# Patient Record
Sex: Male | Born: 1980 | Race: White | Hispanic: No | Marital: Single | Smoking: Never smoker
Health system: Southern US, Community
[De-identification: ages and names within clinical notes are randomized; demographics above are authoritative.]

## PROBLEM LIST (undated history)

## (undated) DIAGNOSIS — K921 Melena: Secondary | ICD-10-CM

## (undated) DIAGNOSIS — R112 Nausea with vomiting, unspecified: Secondary | ICD-10-CM

## (undated) DIAGNOSIS — F329 Major depressive disorder, single episode, unspecified: Secondary | ICD-10-CM

## (undated) DIAGNOSIS — R569 Unspecified convulsions: Secondary | ICD-10-CM

## (undated) DIAGNOSIS — F32A Depression, unspecified: Secondary | ICD-10-CM

## (undated) DIAGNOSIS — B019 Varicella without complication: Secondary | ICD-10-CM

## (undated) DIAGNOSIS — I82409 Acute embolism and thrombosis of unspecified deep veins of unspecified lower extremity: Secondary | ICD-10-CM

## (undated) HISTORY — DX: Depression, unspecified: F32.A

## (undated) HISTORY — PX: CERVICAL FUSION: SHX112

## (undated) HISTORY — DX: Melena: K92.1

## (undated) HISTORY — DX: Major depressive disorder, single episode, unspecified: F32.9

## (undated) HISTORY — PX: SHOULDER SURGERY: SHX246

## (undated) HISTORY — DX: Varicella without complication: B01.9

## (undated) HISTORY — PX: NECK SURGERY: SHX720

## (undated) HISTORY — PX: OTHER SURGICAL HISTORY: SHX169

---

## 2001-10-15 ENCOUNTER — Encounter: Payer: Self-pay | Admitting: Emergency Medicine

## 2001-10-15 ENCOUNTER — Emergency Department (HOSPITAL_COMMUNITY): Admission: EM | Admit: 2001-10-15 | Discharge: 2001-10-15 | Payer: Self-pay | Admitting: Emergency Medicine

## 2001-11-29 ENCOUNTER — Emergency Department (HOSPITAL_COMMUNITY): Admission: EM | Admit: 2001-11-29 | Discharge: 2001-11-29 | Payer: Self-pay | Admitting: Emergency Medicine

## 2001-12-02 ENCOUNTER — Emergency Department (HOSPITAL_COMMUNITY): Admission: EM | Admit: 2001-12-02 | Discharge: 2001-12-02 | Payer: Self-pay | Admitting: *Deleted

## 2018-06-10 ENCOUNTER — Emergency Department (HOSPITAL_COMMUNITY): Payer: 59

## 2018-06-10 ENCOUNTER — Emergency Department (HOSPITAL_COMMUNITY)
Admission: EM | Admit: 2018-06-10 | Discharge: 2018-06-11 | Disposition: A | Discharge status: Home / Self Care | Payer: 59 | Attending: Emergency Medicine | Admitting: Emergency Medicine

## 2018-06-10 ENCOUNTER — Encounter (HOSPITAL_COMMUNITY): Payer: Self-pay

## 2018-06-10 DIAGNOSIS — Y92481 Parking lot as the place of occurrence of the external cause: Secondary | ICD-10-CM | POA: Diagnosis not present

## 2018-06-10 DIAGNOSIS — S40212A Abrasion of left shoulder, initial encounter: Secondary | ICD-10-CM | POA: Insufficient documentation

## 2018-06-10 DIAGNOSIS — S90512A Abrasion, left ankle, initial encounter: Secondary | ICD-10-CM | POA: Diagnosis not present

## 2018-06-10 DIAGNOSIS — Y999 Unspecified external cause status: Secondary | ICD-10-CM | POA: Insufficient documentation

## 2018-06-10 DIAGNOSIS — R413 Other amnesia: Secondary | ICD-10-CM | POA: Diagnosis not present

## 2018-06-10 DIAGNOSIS — Z79899 Other long term (current) drug therapy: Secondary | ICD-10-CM | POA: Diagnosis not present

## 2018-06-10 DIAGNOSIS — Z23 Encounter for immunization: Secondary | ICD-10-CM | POA: Diagnosis not present

## 2018-06-10 DIAGNOSIS — Y9355 Activity, bike riding: Secondary | ICD-10-CM | POA: Insufficient documentation

## 2018-06-10 DIAGNOSIS — Z7901 Long term (current) use of anticoagulants: Secondary | ICD-10-CM | POA: Insufficient documentation

## 2018-06-10 DIAGNOSIS — T07XXXA Unspecified multiple injuries, initial encounter: Secondary | ICD-10-CM

## 2018-06-10 DIAGNOSIS — S50312A Abrasion of left elbow, initial encounter: Secondary | ICD-10-CM | POA: Insufficient documentation

## 2018-06-10 DIAGNOSIS — S0181XA Laceration without foreign body of other part of head, initial encounter: Secondary | ICD-10-CM | POA: Insufficient documentation

## 2018-06-10 DIAGNOSIS — T1490XA Injury, unspecified, initial encounter: Secondary | ICD-10-CM

## 2018-06-10 DIAGNOSIS — S0990XA Unspecified injury of head, initial encounter: Secondary | ICD-10-CM

## 2018-06-10 DIAGNOSIS — S060X1A Concussion with loss of consciousness of 30 minutes or less, initial encounter: Secondary | ICD-10-CM

## 2018-06-10 HISTORY — DX: Unspecified convulsions: R56.9

## 2018-06-10 LAB — CBC
HCT: 41.9 % (ref 39.0–52.0)
Hemoglobin: 13.3 g/dL (ref 13.0–17.0)
MCH: 29 pg (ref 26.0–34.0)
MCHC: 31.7 g/dL (ref 30.0–36.0)
MCV: 91.5 fL (ref 78.0–100.0)
PLATELETS: 268 10*3/uL (ref 150–400)
RBC: 4.58 MIL/uL (ref 4.22–5.81)
RDW: 13.6 % (ref 11.5–15.5)
WBC: 8.1 10*3/uL (ref 4.0–10.5)

## 2018-06-10 LAB — COMPREHENSIVE METABOLIC PANEL
ALT: 33 U/L (ref 0–44)
AST: 26 U/L (ref 15–41)
Albumin: 3.4 g/dL — ABNORMAL LOW (ref 3.5–5.0)
Alkaline Phosphatase: 57 U/L (ref 38–126)
Anion gap: 11 (ref 5–15)
BUN: 21 mg/dL — ABNORMAL HIGH (ref 6–20)
CO2: 18 mmol/L — AB (ref 22–32)
CREATININE: 1.54 mg/dL — AB (ref 0.61–1.24)
Calcium: 8.2 mg/dL — ABNORMAL LOW (ref 8.9–10.3)
Chloride: 107 mmol/L (ref 98–111)
GFR, EST NON AFRICAN AMERICAN: 55 mL/min — AB (ref >60)
GLUCOSE: 112 mg/dL — AB (ref 70–99)
Potassium: 3.7 mmol/L (ref 3.5–5.1)
Sodium: 136 mmol/L (ref 135–145)
TOTAL PROTEIN: 6.8 g/dL (ref 6.5–8.1)
Total Bilirubin: 0.6 mg/dL (ref 0.3–1.2)

## 2018-06-10 LAB — I-STAT CHEM 8, ED
BUN: 25 mg/dL — ABNORMAL HIGH (ref 6–20)
CALCIUM ION: 1.06 mmol/L — AB (ref 1.15–1.40)
CREATININE: 1.5 mg/dL — AB (ref 0.61–1.24)
Chloride: 108 mmol/L (ref 98–111)
Glucose, Bld: 111 mg/dL — ABNORMAL HIGH (ref 70–99)
HEMATOCRIT: 40 % (ref 39.0–52.0)
HEMOGLOBIN: 13.6 g/dL (ref 13.0–17.0)
Potassium: 3.8 mmol/L (ref 3.5–5.1)
Sodium: 141 mmol/L (ref 135–145)
TCO2: 21 mmol/L — AB (ref 22–32)

## 2018-06-10 MED ORDER — TETANUS-DIPHTH-ACELL PERTUSSIS 5-2.5-18.5 LF-MCG/0.5 IM SUSP
0.5000 mL | Freq: Once | INTRAMUSCULAR | Status: AC
  Administered 2018-06-11: 0.5 mL via INTRAMUSCULAR
  Filled 2018-06-10: qty 0.5

## 2018-06-10 MED ORDER — SODIUM CHLORIDE 0.9 % IV BOLUS
1000.0000 mL | Freq: Once | INTRAVENOUS | Status: AC
  Administered 2018-06-11: 1000 mL via INTRAVENOUS

## 2018-06-10 MED ORDER — FENTANYL CITRATE (PF) 100 MCG/2ML IJ SOLN
50.0000 ug | Freq: Once | INTRAMUSCULAR | Status: AC
  Administered 2018-06-11: 50 ug via INTRAVENOUS
  Filled 2018-06-10: qty 2

## 2018-06-10 NOTE — ED Notes (Signed)
In CT at this time
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
## 2018-06-10 NOTE — ED Notes (Signed)
Delay in lab draw,  Pt not in room 
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
## 2018-06-11 ENCOUNTER — Other Ambulatory Visit (HOSPITAL_COMMUNITY): Payer: Self-pay

## 2018-06-11 ENCOUNTER — Emergency Department (HOSPITAL_COMMUNITY): Payer: 59

## 2018-06-11 LAB — PROTIME-INR
INR: 1.01
Prothrombin Time: 13.2 seconds (ref 11.4–15.2)

## 2018-06-11 MED ORDER — HYDROCODONE-ACETAMINOPHEN 5-325 MG PO TABS: 1.0000 | ORAL_TABLET | Freq: Three times a day (TID) | ORAL | 0 refills | Status: AC | PRN

## 2018-06-11 MED ORDER — FENTANYL CITRATE (PF) 100 MCG/2ML IJ SOLN
50.0000 ug | Freq: Once | INTRAMUSCULAR | Status: AC
  Administered 2018-06-11: 50 ug via INTRAVENOUS
  Filled 2018-06-11: qty 2

## 2018-06-11 MED ORDER — ACETAMINOPHEN 500 MG PO TABS: 1000.0000 mg | ORAL_TABLET | Freq: Three times a day (TID) | ORAL | 0 refills | Status: AC

## 2018-06-11 MED ORDER — LIDOCAINE-EPINEPHRINE (PF) 2 %-1:200000 IJ SOLN
10.0000 mL | Freq: Once | INTRAMUSCULAR | Status: AC
  Administered 2018-06-11: 10 mL via INTRADERMAL

## 2018-06-11 MED ORDER — LIDOCAINE-EPINEPHRINE 2 %-1:100000 IJ SOLN
10.0000 mL | Freq: Once | INTRAMUSCULAR | Status: DC
  Filled 2018-06-11: qty 10

## 2018-06-11 MED ORDER — IOHEXOL 300 MG/ML  SOLN
100.0000 mL | Freq: Once | INTRAMUSCULAR | Status: AC | PRN
  Administered 2018-06-11: 100 mL via INTRAVENOUS

## 2018-06-11 NOTE — ED Notes (Signed)
Assisted patient to dress in scrubs to go home.  Continues to have pain all over with difficulty bearing weight on left ankle.  Dr Eudelia Bunchcardama made aware.  Attempted to place cam walker but unable to tolerate.  Sent home with patient.  Able to turn and pivot from bed to chair and chair to car.

## 2018-06-12 ENCOUNTER — Telehealth: Payer: Self-pay
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
## 2018-06-14 ENCOUNTER — Ambulatory Visit: Payer: 59 | Admitting: Family Medicine

## 2018-06-14 ENCOUNTER — Encounter: Payer: Self-pay | Admitting: Family Medicine

## 2018-06-14 DIAGNOSIS — S93492A Sprain of other ligament of left ankle, initial encounter: Secondary | ICD-10-CM | POA: Diagnosis not present

## 2018-06-14 DIAGNOSIS — S060XAA Concussion with loss of consciousness status unknown, initial encounter: Secondary | ICD-10-CM | POA: Insufficient documentation

## 2018-06-14 DIAGNOSIS — S060X1A Concussion with loss of consciousness of 30 minutes or less, initial encounter: Secondary | ICD-10-CM | POA: Diagnosis not present

## 2018-06-14 DIAGNOSIS — S93402A Sprain of unspecified ligament of left ankle, initial encounter: Secondary | ICD-10-CM | POA: Insufficient documentation

## 2018-06-14 DIAGNOSIS — S060X9A Concussion with loss of consciousness of unspecified duration, initial encounter: Secondary | ICD-10-CM | POA: Insufficient documentation

## 2018-06-14 NOTE — Patient Instructions (Addendum)
Good to see you  Gabriel Whitney is your friend. Ice 20 minutes 2 times daily. Usually after activity and before bed. pennsaid pinkie amount topically 2 times daily as needed.  For the head  Fish oil 3 grams daily  Vitamin D 2000 IU daily  CoQ10 200mg  daily  I will see you again on Monday

## 2018-06-14 NOTE — Assessment & Plan Note (Signed)
Patient has significant tenderness on the lateral fibular region.  Discussed with patient about a cam walker.  Do not feel comfortable with patient being on crutches at the moment.  Patient did have x-rays at the time of injury there were independently visualized by me showing no significant bony abnormality but joint effusion noted.  He did continue to have pain follow-up will consider further evaluation.

## 2018-06-14 NOTE — Assessment & Plan Note (Signed)
Mild concussion possible.  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  We discussed over-the-counter medications, patient will come back again in 5 days for retesting.  Hopefully will be able to get him back to work soon.  I do not see any concern for this taking a long time but patient's other comorbidities makes evaluation difficult.

## 2018-06-15 NOTE — Progress Notes (Signed)
Tawana ScaleZach Shakeria Robinette D.O. [facility] [address] [place], [place] [address] Phone: [phone] Subjective:    I'm seeing this patient by the request  of:    CC: Head injury follow-up  VFI:[id number]:Subjective  Gabriel Whitney is a 37 y.o. male coming in with complaint of head injury. He has been improving. Still feels dizziness and has pain in the left frontal bone. The blurriness in his left eye is still present but improving.  Symptom score improved from previous exam.  Still only the dizziness seems to be emanating.  Headache significantly improved.     Past Medical History:  Diagnosis Date  . Blood in stool   . Chicken pox   . Depression   . Seizures (HCC)    Past Surgical History:  Procedure Laterality Date  . arthroscopic right shoulder    . CERVICAL FUSION    . NECK SURGERY    . SHOULDER SURGERY Left   . SHOULDER SURGERY Right    Social History   Socioeconomic History  . Marital status: Single    Spouse name: Not on file  . Number of children: Not on file  . Years of education: Not on file  . Highest education level: Not on file  Occupational History  . Not on file  Social Needs  . Financial resource strain: Not on file  . Food insecurity:    Worry: Not on file    Inability: Not on file  . Transportation needs:    Medical: Not on file    Non-medical: Not on file  Tobacco Use  . Smoking status: Never Smoker  . Smokeless tobacco: Never Used  Substance and Sexual Activity  . Alcohol use: Yes    Frequency: Never  . Drug use: Yes    Types: Marijuana  . Sexual activity: Not on file  Lifestyle  . Physical activity:    Days per week: Not on file    Minutes per session: Not on file  . Stress: Not on file  Relationships  . Social connections:    Talks on phone: Not on file    Gets together: Not on file    Attends religious service: Not on file    Active member of club or organization: Not on file    Attends meetings of clubs or organizations: Not on file    Relationship status: Not on file  Other Topics Concern  . Not on file  Social History Narrative  . Not on file   No Known Allergies No family history on file.   Past medical history, social, surgical and family history all reviewed in electronic medical record.  No pertanent information unless stated regarding to the chief complaint.   Review of Systems:Review of systems updated and as accurate as of 06/19/18  No  visual changes, nausea, vomiting, diarrhea, constipation,  abdominal pain, skin rash, fevers, chills, night sweats, weight loss, swollen lymph nodes, body aches, joint swelling, muscle aches, chest pain, shortness of breath, mood changes.  Positive headaches dizziness  Objective  Blood pressure 138/88, pulse 79, height 6\' 2"  (1.88 m), weight 173 lb (78.5 kg), SpO2 (!) 81 %. Systems examined below as of 06/19/18   General: No apparent distress alert and oriented x3 mood and affect normal, dressed appropriately.  Patient though is very anxious HEENT: Pupils equal, extraocular movements intact  Respiratory: Patient's speak in full sentences and does not appear short of breath  Cardiovascular: No lower extremity edema, non tender, no erythema  Skin: Warm dry intact with no signs of infection or rash on extremities or on axial skeleton.  Abdomen: Soft nontender  Neuro: Cranial nerves II through XII are intact, neurovascularly intact in all extremities with 2+ DTRs and 2+ pulses.  Lymph: No lymphadenopathy of posterior or anterior cervical chain or axillae bilaterally.  Gait normal with good balance and coordination.  MSK:  Non tender with full range of motion and good stability and symmetric strength and tone of shoulders, elbows, wrist, hip, knee and ankles bilaterally.  Neck: Inspection mild loss of lordosis surgical incision well-healed. No palpable stepoffs. Negative Spurling's maneuver. Patient does have some limited range of motion in all planes. Grip strength and  sensation normal in bilateral hands Strength good C4 to T1 distribution No sensory change to C4 to T1 Negative Hoffman sign bilaterally Reflexes normal    Impression and Recommendations:     This case required medical decision making of moderate complexity.      Note: This dictation was prepared with Dragon dictation along with smaller phrase technology. Any transcriptional errors that result from this process are unintentional.

## 2018-06-19 ENCOUNTER — Ambulatory Visit: Payer: 59 | Admitting: Family Medicine

## 2018-06-19 ENCOUNTER — Telehealth: Payer: Self-pay | Admitting: Family Medicine

## 2018-06-19 VITALS — BP 138/88 | HR 79 | Ht 74.0 in | Wt 173.0 lb

## 2018-06-19 DIAGNOSIS — S060X1D Concussion with loss of consciousness of 30 minutes or less, subsequent encounter: Secondary | ICD-10-CM

## 2018-06-19 DIAGNOSIS — R279 Unspecified lack of coordination: Secondary | ICD-10-CM

## 2018-06-19 NOTE — Patient Instructions (Signed)
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  See me again in 10 days

## 2018-06-19 NOTE — Assessment & Plan Note (Signed)
Mild concussion, is improving.  Patient given a note for 4-hour workdays but seem to work only with no driving.  Patient has any worsening symptoms so he is to call us.  Making significant progress though.  Will start with vestibular neuro secondary to patient still having some vertigo-like symptoms.  Follow-up again in 10 days for further evaluation and treatment.

## 2018-06-19 NOTE — Telephone Encounter (Signed)
Copied from CRM [id number]. Topic: Inquiry >> Jun 19, 2018  2:50 PM Alexander BergeronBarksdale, Gabriel Whitney: Reason for CRM: pt called to speak w/ Dr. Katrinka BlazingSmith or nurse about his note for work; pt is needing to be completely out of work; contact pt to advise on a resolution

## 2018-06-29 ENCOUNTER — Ambulatory Visit: Payer: 59 | Admitting: Family Medicine

## 2018-06-29 ENCOUNTER — Encounter: Payer: Self-pay | Admitting: Family Medicine

## 2018-06-29 DIAGNOSIS — S060X1D Concussion with loss of consciousness of 30 minutes or less, subsequent encounter: Secondary | ICD-10-CM

## 2018-06-29 NOTE — Progress Notes (Signed)
Tawana ScaleZach Smith D.O. [facility] [address] [place], [place] [address] Phone: [phone] Subjective:   Gabriel Whitney, Gabriel Whitney, am serving as a scribe for Dr. Antoine PrimasZachary Smith.    CC: Head injury follow-up  BJY:[id number]:Subjective  Gabriel JohnBrian Whitney is a 37 y.o. male coming in with complaint of head injury. He has not begun physical therapy. Has not had dizziness since last visit. Attributes this to OMT. Vision has also improved. No headaches since last visit. Patient feels like he is 100%.  Looking forward to getting back to work.  Not having any difficulty with concentration states that the headaches are very minimal at this time.      Past Medical History:  Diagnosis Date  . Blood in stool   . Chicken pox   . Depression   . Seizures (HCC)    Past Surgical History:  Procedure Laterality Date  . arthroscopic right shoulder    . CERVICAL FUSION    . NECK SURGERY    . SHOULDER SURGERY Left   . SHOULDER SURGERY Right    Social History   Socioeconomic History  . Marital status: Single    Spouse name: Not on file  . Number of children: Not on file  . Years of education: Not on file  . Highest education level: Not on file  Occupational History  . Not on file  Social Needs  . Financial resource strain: Not on file  . Food insecurity:    Worry: Not on file    Inability: Not on file  . Transportation needs:    Medical: Not on file    Non-medical: Not on file  Tobacco Use  . Smoking status: Never Smoker  . Smokeless tobacco: Never Used  Substance and Sexual Activity  . Alcohol use: Yes    Frequency: Never  . Drug use: Yes    Types: Marijuana  . Sexual activity: Not on file  Lifestyle  . Physical activity:    Days per week: Not on file    Minutes per session: Not on file  . Stress: Not on file  Relationships  . Social connections:    Talks on phone: Not on file    Gets together: Not on file    Attends religious service: Not on file    Active member of club or  organization: Not on file    Attends meetings of clubs or organizations: Not on file    Relationship status: Not on file  Other Topics Concern  . Not on file  Social History Narrative  . Not on file   No Known Allergies No family history on file.      Current Outpatient Medications (Hematological):  Marland Kitchen.  ELIQUIS 5 MG TABS tablet, Take 5 mg by mouth 2 (two) times daily.     Past medical history, social, surgical and family history all reviewed in electronic medical record.  No pertanent information unless stated regarding to the chief complaint.   Review of Systems:  No headache, visual changes, nausea, vomiting, diarrhea, constipation, dizziness, abdominal pain, skin rash, fevers, chills, night sweats, weight loss, swollen lymph nodes, body aches, joint swelling, muscle aches, chest pain, shortness of breath, mood changes.   Objective  Blood pressure (!) 132/92, height 6\' 2"  (1.88 m), weight 194 lb (88 kg). Systems examined below as of    General: No apparent distress alert and oriented x3 mood and affect normal, dressed appropriately.  HEENT: Pupils equal, extraocular movements intact  Respiratory: Patient's speak  in full sentences and does not appear short of breath  Cardiovascular: No lower extremity edema, non tender, no erythema  Skin: Warm dry intact with no signs of infection or rash on extremities or on axial skeleton.  Abdomen: Soft nontender  Neuro: Cranial nerves II through XII are intact, neurovascularly intact in all extremities with 2+ DTRs and 2+ pulses.  Lymph: No lymphadenopathy of posterior or anterior cervical chain or axillae bilaterally.  Gait normal with good balance and coordination.  MSK:  Non tender with full range of motion and good stability and symmetric strength and tone of shoulders, elbows, wrist, hip, knee bilaterally.  Left ankle does have some tightness with very minimal.  Seems to be a potential sprain with some mild pain over the ATFL.     Impression and Recommendations:     This case required medical decision making of moderate complexity. The above documentation has been reviewed and is accurate and complete Judi Saa, DO       Note: This dictation was prepared with Dragon dictation along with smaller phrase technology. Any transcriptional errors that result from this process are unintentional.

## 2018-06-29 NOTE — Patient Instructions (Signed)
Good to see you  You are doing great  Finish up with the vitamins You should do great  You know where we are if you need us!

## 2018-06-29 NOTE — Assessment & Plan Note (Signed)
Resolved at this time.  Will start work again tomorrow full duty with no restrictions.  If any exacerbation patient will call otherwise we will follow-up as needed

## 2021-10-20 ENCOUNTER — Other Ambulatory Visit: Payer: Self-pay | Admitting: Orthopedic Surgery

## 2021-10-20 DIAGNOSIS — M259 Joint disorder, unspecified: Secondary | ICD-10-CM

## 2021-11-26 ENCOUNTER — Ambulatory Visit
Admission: RE | Admit: 2021-11-26 | Discharge: 2021-11-26 | Disposition: A | Discharge status: Home / Self Care | Payer: Self-pay | Source: Ambulatory Visit | Attending: Orthopedic Surgery | Admitting: Orthopedic Surgery

## 2021-11-26 ENCOUNTER — Other Ambulatory Visit: Payer: Self-pay

## 2021-11-26 DIAGNOSIS — M259 Joint disorder, unspecified: Secondary | ICD-10-CM

## 2022-03-29 IMAGING — CT CT BIOPSY
1 of 4 series · 12 of 32 positions shown, 18 images · non-contrast
Comparison: none

CLINICAL DATA: Left   hip, lower extremity pain

[Series 2: needle -guided injection · axial · 0.67mm/px · z∈[-81,-17]mm · 12 of 38 slices shown, 18 images]
[im 3/38  soft-tissue]
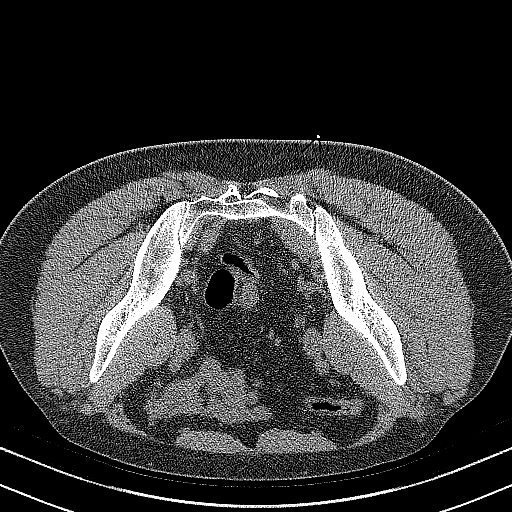
[im 3/38  bone]
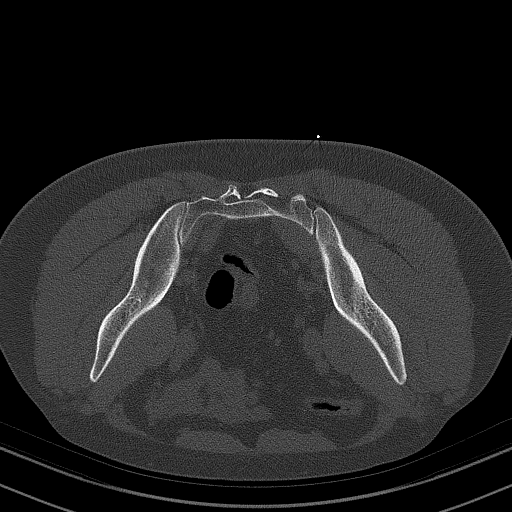
[im 6/38  soft-tissue]
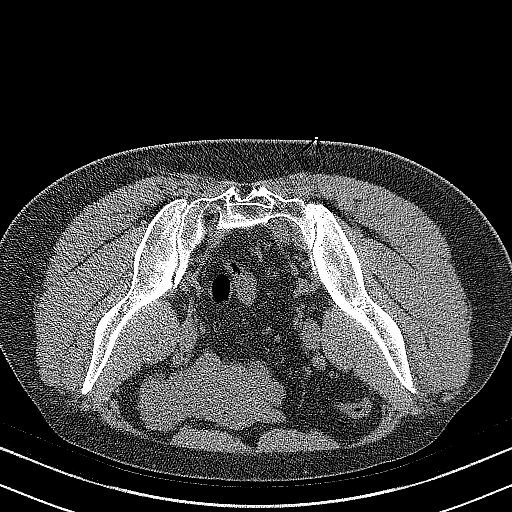
[im 9/38  soft-tissue]
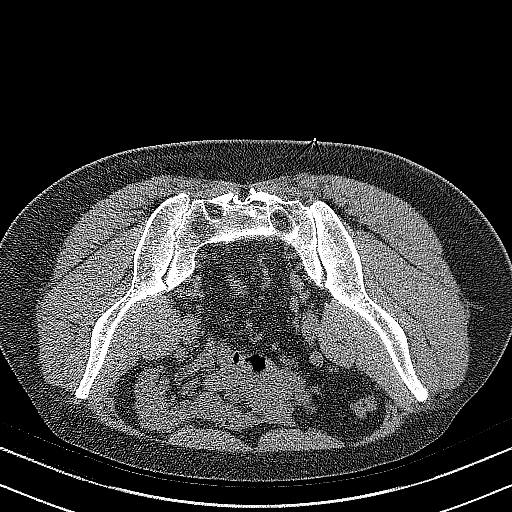
[im 12/38  soft-tissue]
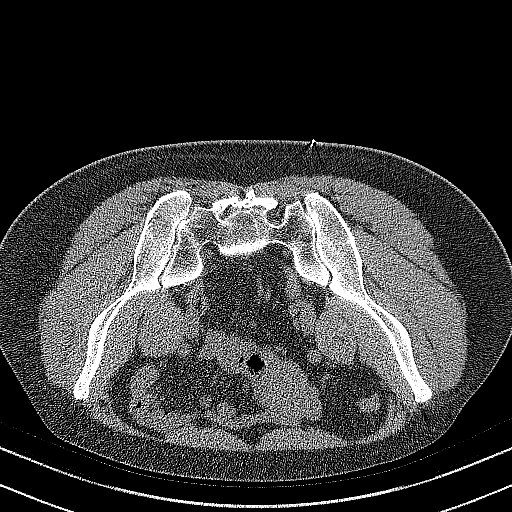
[im 15/38  soft-tissue]
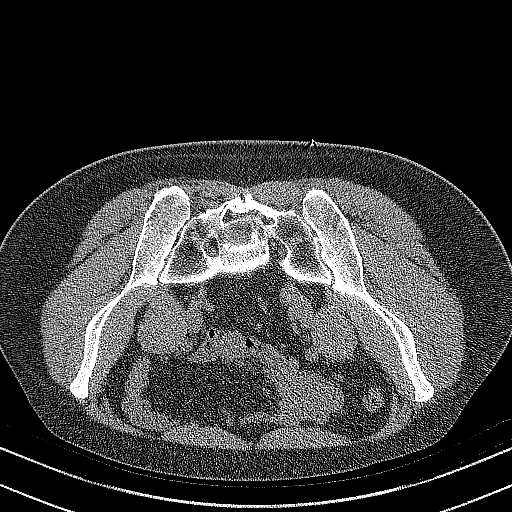
[im 18/38  soft-tissue]
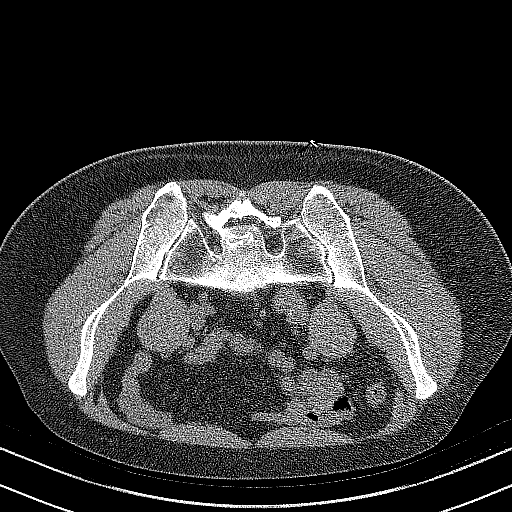
[im 20/38  soft-tissue]
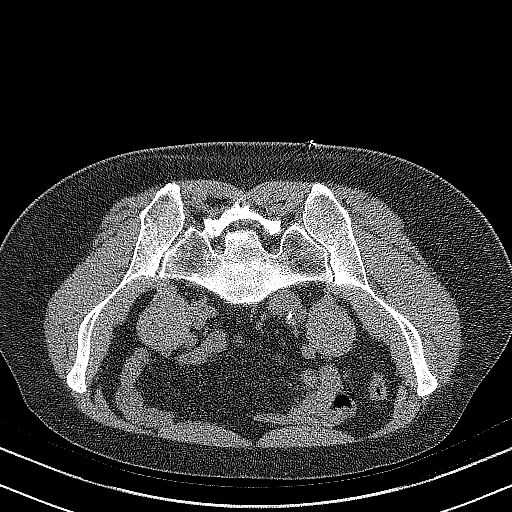
[im 23/38  soft-tissue]
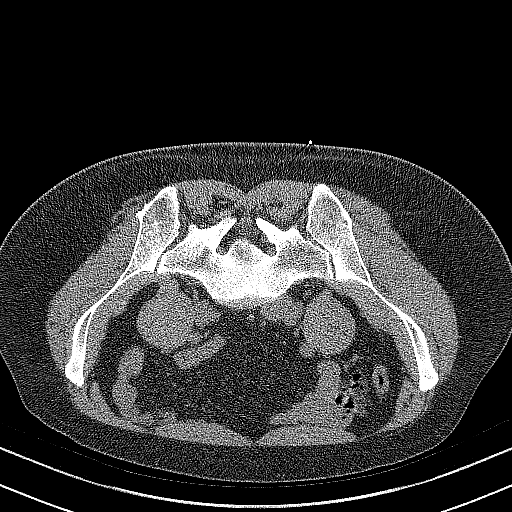
[im 26/38  soft-tissue]
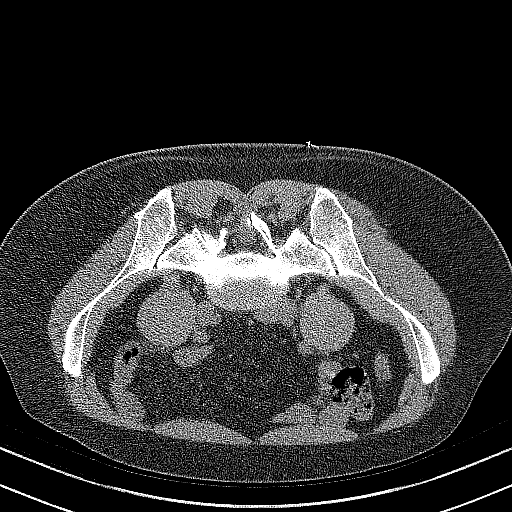
[im 26/38  lung]
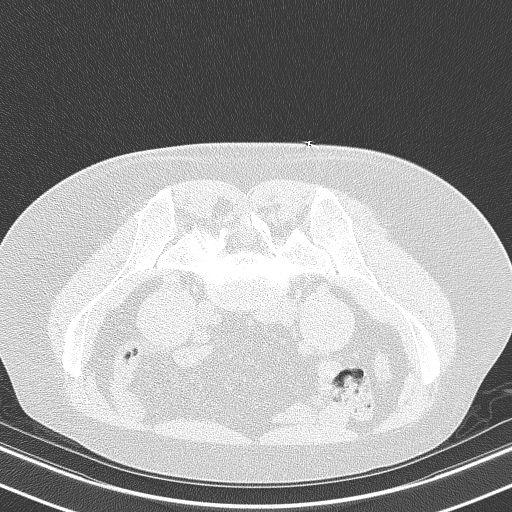
[im 26/38  bone]
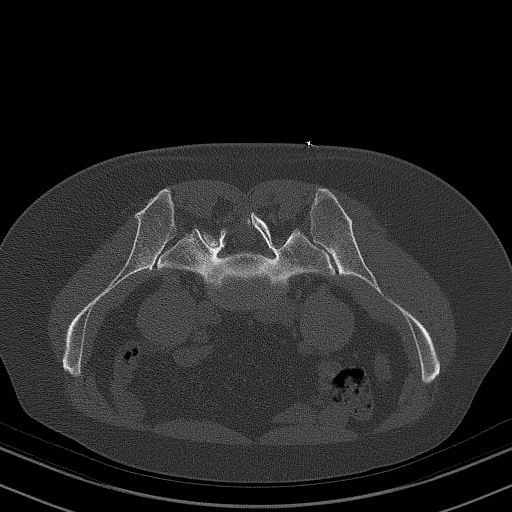
[im 29/38  soft-tissue]
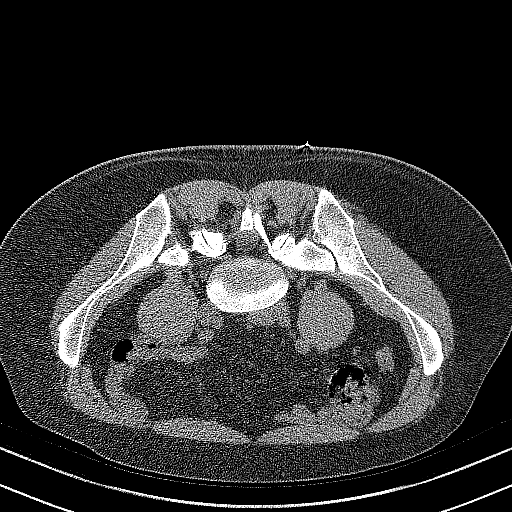
[im 29/38  lung]
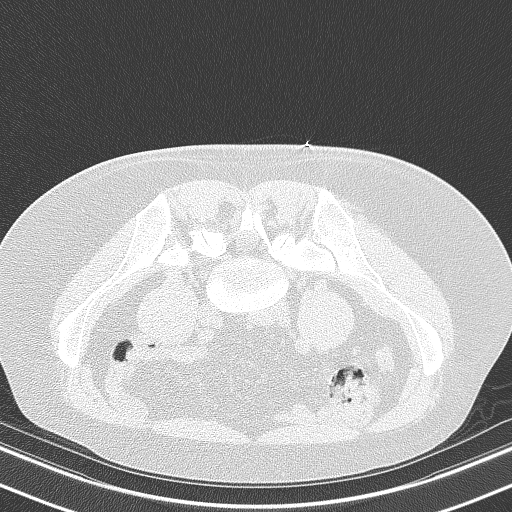
[im 32/38  soft-tissue]
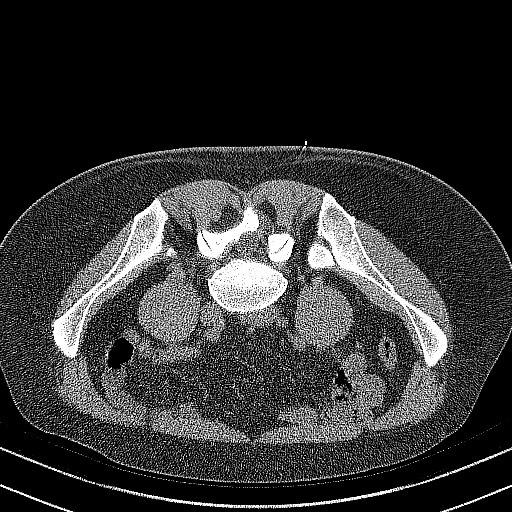
[im 32/38  lung]
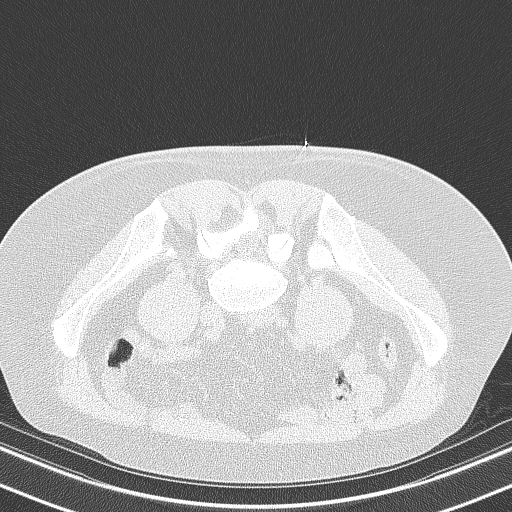
[im 35/38  soft-tissue]
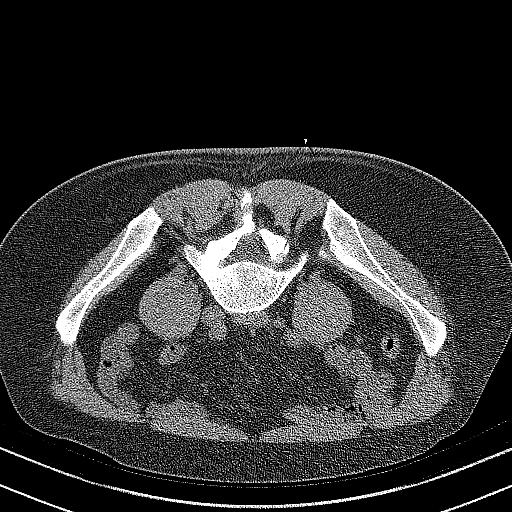
[im 35/38  lung]
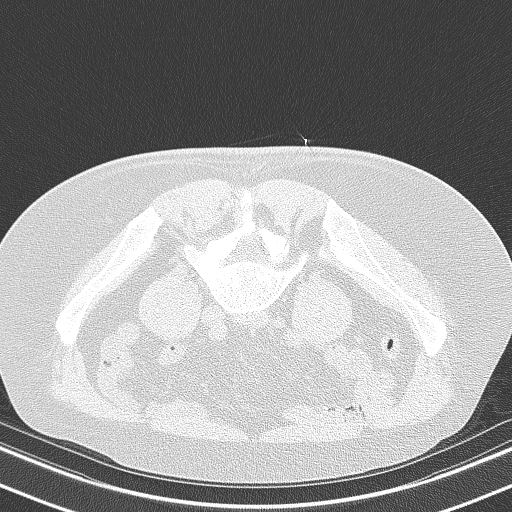

[12 of 32 positions shown; findings below may reference images not displayed]

EXAM:
LEFT CT GUIDED SI JOINT INJECTION



After local anesthesia with 1% lidocaine without epinephrine and
subsequent deep anesthesia, a standard 22 gauge spinal needle was
advanced into the posteroinferior margin left SI joint under
intermittent CT guidance.

Once the needle was in satisfactory position, representative image
was captured with the needle demonstrated in the sacroiliac joint.
Subsequently, 3 mL bupivacaine 0.5% was injected into the left SI
joint. Needles removed and a sterile dressing applied.

No complications were observed.
IMPRESSION: Successful CT-guided left SI joint injection.

## 2024-08-07 ENCOUNTER — Telehealth (HOSPITAL_BASED_OUTPATIENT_CLINIC_OR_DEPARTMENT_OTHER): Payer: Self-pay | Admitting: Orthopaedic Surgery

## 2024-08-07 NOTE — Telephone Encounter (Signed)
 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

## 2024-08-24 ENCOUNTER — Ambulatory Visit (HOSPITAL_BASED_OUTPATIENT_CLINIC_OR_DEPARTMENT_OTHER): Payer: Self-pay | Admitting: Orthopaedic Surgery

## 2024-08-24 DIAGNOSIS — G5702 Lesion of sciatic nerve, left lower limb: Secondary | ICD-10-CM

## 2024-08-24 NOTE — Progress Notes (Signed)
 Chief Complaint: Left hip pain     History of Present Illness:    Gabriel Whitney is a 43 y.o. male presents today with 3 years of ongoing radiating pain down the left hip.  Has been seen by both Dr. Burnetta and Dr. Beuford.  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  He did have an epidural injection as well as physical therapy under the guidance of Dr. Burnetta.  He subsequently was referred here by Dr. Beuford for assessment of possible piriformis syndrome.  He does have pain focally about the posterior ischiofemoral space with shooting pain down the leg in this distribution.  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    PMH/PSH/Family History/Social History/Meds/Allergies:    Past Medical History:  Diagnosis Date   Blood in stool    Chicken pox    Depression    Seizures (HCC)    Past Surgical History:  Procedure Laterality Date   arthroscopic right shoulder     CERVICAL FUSION     NECK SURGERY     SHOULDER SURGERY Left    SHOULDER SURGERY Right    Social History   Socioeconomic History   Marital status: Single    Spouse name: Not on file   Number of children: Not on file   Years of education: Not on file   Highest education level: Not on file  Occupational History   Not on file  Tobacco Use   Smoking status: Never   Smokeless tobacco: Never  Substance and Sexual Activity   Alcohol use: Yes   Drug use: Yes    Types: Marijuana   Sexual activity: Not on file  Other Topics Concern   Not on file  Social History Narrative   Not on file   Social Drivers of Health   Financial Resource Strain: Not on file  Food Insecurity: Not on file  Transportation Needs: Not on file  Physical Activity: Not on file  Stress: Not on file  Social Connections: Unknown (03/09/2022)   Received from [facility]   Social Network    Social Network: Not on file   No family history on  file. No Known Allergies Current Outpatient Medications  Medication Sig Dispense Refill   ELIQUIS 5 MG TABS tablet Take 5 mg by mouth 2 (two) times daily.  0   No current facility-administered medications for this visit.   No results found.  Review of Systems:   A ROS was performed including pertinent positives and negatives as documented in the HPI.  Physical Exam :   Constitutional: NAD and appears stated age Neurological: Alert and oriented Psych: Appropriate affect and cooperative There were no vitals taken for this visit.   Comprehensive Musculoskeletal Exam:    Left hip with focal pain about the posterior piriformis ischiofemoral space which is worsened with internal rotation of the left hip.  Distal neurosensory exam is intact with shooting radiating pain down the posterior left thigh positive straight leg raise   Imaging:      I personally reviewed and interpreted the radiographs.   Assessment and Plan:   43 y.o. 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  He has trialed physical therapy and has had an epidural injection without significant relief.  At this time we will plan for referral to Dr. Burnetta for ultrasound-guided  sciatic neurolysis around the piriformis for diagnostic purposes.  I will plan to see him back in a month and I did discuss that if this is positive we will could pursue piriformis release.  - Return to clinic 1 month for reassessment   I personally saw and evaluated the patient, and participated in the management and treatment plan.  Elspeth Parker, MD Attending Physician, Orthopedic Surgery  This document was dictated using Dragon voice recognition software. A reasonable attempt at proof reading has been made to minimize errors.

## 2024-09-03 ENCOUNTER — Encounter: Payer: Self-pay | Admitting: Radiology

## 2024-09-07 ENCOUNTER — Ambulatory Visit: Admitting: Sports Medicine

## 2024-09-07 ENCOUNTER — Encounter: Payer: Self-pay | Admitting: Sports Medicine

## 2024-09-07 ENCOUNTER — Other Ambulatory Visit: Payer: Self-pay

## 2024-09-07 DIAGNOSIS — M7918 Myalgia, other site: Secondary | ICD-10-CM | POA: Diagnosis not present

## 2024-09-07 DIAGNOSIS — G5702 Lesion of sciatic nerve, left lower limb: Secondary | ICD-10-CM

## 2024-09-07 DIAGNOSIS — M5432 Sciatica, left side: Secondary | ICD-10-CM

## 2024-09-07 NOTE — Progress Notes (Signed)
 Gabriel Whitney - 43 y.o. male MRN [id number]  Date of birth: 1981-10-15  Office Visit Note: Visit Date: 09/07/2024 PCP: Patient, No Pcp Per Referred by: Genelle Standing, MD  Subjective: Chief Complaint  Patient presents with   Left Hip - Pain   HPI: Gabriel Whitney is a pleasant 43 y.o. male who presents today for chronic posterior hip/buttock/leg pain.  He has had right-sided posterior buttock and leg pain for about 3 years now.  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  He initially saw both Dr. Donaciano Sprang and Dr. Beuford who are evaluating him for his back/radiculopathy. Through [facility], he did have 2 previous epidural injections as well as a CT-guided left SI joint injection, none of these were helpful for his pain.  Continues with pain more so in the buttock but it will radiate down the posterior leg as well.  Saw one of my partners, Dr. Genelle at [facility] who thought this could be from piriformis syndrome.  He is here today for further management and consideration of injection therapy.  He has been on several medications and does use Tylenol  or over-the-counter anti-inflammatories but does limit this given his concomitant Eliquis use.  Pertinent ROS were reviewed with the patient and found to be negative unless otherwise specified above in HPI.   Assessment & Plan: Visit Diagnoses:  1. Piriformis syndrome, left   2. Sciatica of left side   3. Pain in left buttock    Plan: Impression is acute on chronic left buttock and radiating left posterior leg pain.  Previous injections and workup were negative for lumbar radiculopathy.  We discussed for both diagnostic and hopefully therapeutic purposes, moving forward with ultrasound-guided piriformis sheath injection and associated sciatic nerve Hydro dissection.  This was performed today.  [name] to really pay attention over the next few days to 2 weeks to see what  degree of relief he receives from the injection and the location of improvement.  He will follow-up with Dr. Genelle in a few weeks to review this and discuss next steps.  If this gives him significant relief, even temporarily, we could consider repeating this injection down the road versus considering piriformis release with Dr. Genelle.  Okay for ice/heat or Tylenol  going forward.  Follow-up: Return for f/u with Dr. Genelle for L-piriformis/leg.   Meds & Orders: No orders of the defined types were placed in this encounter.   Orders Placed This Encounter  Procedures   US  Guided Needle Placement - No Linked Charges     Procedures: Procedure: US -guided piriformis injection with sciatic nerve hydrodissection, Left After discussion on risks/benefits/indications and informed verbal consent was obtained, a timeout was performed. Patient was lying prone on exam table.The posterior hip and buttock was cleaned with Chloraprep and multiple alcohol swabs. Then utilizing ultrasound guidance, the patient's piriformis muscle and sheath and associated inferior exiting sciatic nerve was identified in a short-axis plane.The area of soft tissue was first anesthesized with 5 cc of lidocaine  1%. Then, using an in-plane lateral to medial approach using ultrasound-guidance the piriformis muscle sheath was injected with associated sciatic nerve hydrodissection completed with a combination of 2:2:1.5:6.5 lidocaine :bupivicaine:methylprednisolone:D50 with visualization of spread through the muscular sheath and appropriate 360 degree lysis of the sciatic nerve.  During the Hydrodissection, there was a point where he felt sharp pain shooting down the leg/region of the sciatic nerve, this was brief and  did abate following procedure. Patient otherwise tolerated well without immediate complications.  Ambulated well without difficulty following.  Band-Aid was applied.         Clinical History: No specialty comments available.   He reports that he has never smoked. He has never used smokeless tobacco. No results for input(s): HGBA1C, LABURIC in the last 8760 hours.  Objective:    Physical Exam  Gen: Well-appearing, in no acute distress; non-toxic CV: Well-perfused. Warm.  Resp: Breathing unlabored on room air; no wheezing. Psych: Fluid speech in conversation; appropriate affect; normal thought process  Ortho Exam - Left buttock: + TTP especially with deep palpation within the piriformis musculature, there is a degree of Tinel's testing here near the piriformis and exiting sciatic nerve.  Negative SLR.  Imaging: No results found.  Past Medical/Family/Surgical/Social History: Medications & Allergies reviewed per EMR, new medications updated. Patient Active Problem List   Diagnosis Date Noted   Mild concussion 06/14/2018   Left ankle sprain 06/14/2018   Past Medical History:  Diagnosis Date   Blood in stool    Chicken pox    Depression    Seizures (HCC)    History reviewed. No pertinent family history. Past Surgical History:  Procedure Laterality Date   arthroscopic right shoulder     CERVICAL FUSION     NECK SURGERY     SHOULDER SURGERY Left    SHOULDER SURGERY Right    Social History   Occupational History   Not on file  Tobacco Use   Smoking status: Never   Smokeless tobacco: Never  Substance and Sexual Activity   Alcohol use: Yes   Drug use: Yes    Types: Marijuana   Sexual activity: Not on file

## 2024-09-26 ENCOUNTER — Ambulatory Visit (HOSPITAL_BASED_OUTPATIENT_CLINIC_OR_DEPARTMENT_OTHER): Admitting: Orthopaedic Surgery

## 2024-09-26 ENCOUNTER — Ambulatory Visit (HOSPITAL_BASED_OUTPATIENT_CLINIC_OR_DEPARTMENT_OTHER): Payer: Self-pay | Admitting: Orthopaedic Surgery

## 2024-09-26 DIAGNOSIS — G5702 Lesion of sciatic nerve, left lower limb: Secondary | ICD-10-CM

## 2024-09-26 NOTE — Progress Notes (Signed)
 Chief Complaint: Left hip pain     History of Present Illness:   09/26/2024: Presents today for follow-up status post left hip sciatic neurolysis with Dr. Burnetta.  He did get extremely good relief from this  Gabriel Whitney is a 43 y.o. male presents today with 3 years of ongoing radiating pain down the left hip.  Has been seen by both Dr. Burnetta and Dr. Beuford.  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  He did have an epidural injection as well as physical therapy under the guidance of Dr. Burnetta.  He subsequently was referred here by Dr. Beuford for assessment of possible piriformis syndrome.  He does have pain focally about the posterior ischiofemoral space with shooting pain down the leg in this distribution.  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    PMH/PSH/Family History/Social History/Meds/Allergies:    Past Medical History:  Diagnosis Date   Blood in stool    Chicken pox    Depression    Seizures (HCC)    Past Surgical History:  Procedure Laterality Date   arthroscopic right shoulder     CERVICAL FUSION     NECK SURGERY     SHOULDER SURGERY Left    SHOULDER SURGERY Right    Social History   Socioeconomic History   Marital status: Single    Spouse name: Not on file   Number of children: Not on file   Years of education: Not on file   Highest education level: Not on file  Occupational History   Not on file  Tobacco Use   Smoking status: Never   Smokeless tobacco: Never  Substance and Sexual Activity   Alcohol use: Yes   Drug use: Yes    Types: Marijuana   Sexual activity: Not on file  Other Topics Concern   Not on file  Social History Narrative   Not on file   Social Drivers of Health   Financial Resource Strain: Not on file  Food Insecurity: Not on file  Transportation Needs: Not on file  Physical Activity: Not on file  Stress: Not on file   Social Connections: Unknown (03/09/2022)   Received from [facility]   Social Network    Social Network: Not on file   No family history on file. No Known Allergies Current Outpatient Medications  Medication Sig Dispense Refill   ELIQUIS 5 MG TABS tablet Take 5 mg by mouth 2 (two) times daily.  0   No current facility-administered medications for this visit.   No results found.  Review of Systems:   A ROS was performed including pertinent positives and negatives as documented in the HPI.  Physical Exam :   Constitutional: NAD and appears stated age Neurological: Alert and oriented Psych: Appropriate affect and cooperative There were no vitals taken for this visit.   Comprehensive Musculoskeletal Exam:    Left hip with focal pain about the posterior piriformis ischiofemoral space which is worsened with internal rotation of the left hip.  Distal neurosensory exam is intact with shooting radiating pain down the posterior left thigh positive straight leg raise   Imaging:      I personally reviewed and interpreted the radiographs.   Assessment and Plan:   43 y.o. 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  He has trialed physical therapy  and has had an epidural injection without significant relief.  He did get very good relief from his diagnostic injection with Dr. Burnetta around the sciatic nerve.  Given this I did discuss that he ultimately would be a candidate for piriformis release.  I discussed risk limitations associated with this as well as SOSi recovery timeframe.  After discussion he would like to proceed  - For left hip piriformis release   After a lengthy discussion of treatment options, including risks, benefits, alternatives, complications of surgical and nonsurgical conservative options, the patient elected surgical repair.   The patient  is aware of the material risks  and complications including, but not  limited to injury to adjacent structures, neurovascular injury, infection, numbness, bleeding, implant failure, thermal burns, stiffness, persistent pain, failure to heal, disease transmission from allograft, need for further surgery, dislocation, anesthetic risks, blood clots, risks of death,and others. The probabilities of surgical success and failure discussed with patient given their particular co-morbidities.The time and nature of expected rehabilitation and recovery was discussed.The patient's questions were all answered preoperatively.  No barriers to understanding were noted. I explained the natural history of the disease process and Rx rationale.  I explained to the patient what I considered to be reasonable expectations given their personal situation.  The final treatment plan was arrived at through a shared patient decision making process model.    I personally saw and evaluated the patient, and participated in the management and treatment plan.  Elspeth Parker, MD Attending Physician, Orthopedic Surgery  This document was dictated using Dragon voice recognition software. A reasonable attempt at proof reading has been made to minimize errors.

## 2024-10-05 ENCOUNTER — Telehealth: Payer: Self-pay | Admitting: Orthopaedic Surgery

## 2024-10-05 NOTE — Telephone Encounter (Signed)
 I spoke with the patient today and scheduled him for surgery on 11/27/24. Patient forgot to asked how long is the recovery period post surgery. 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 Please call to advise.

## 2024-10-10 ENCOUNTER — Telehealth: Payer: Self-pay | Admitting: Orthopaedic Surgery

## 2024-10-10 NOTE — Telephone Encounter (Signed)
 LMOM stating I sent a mychart message on Tuesday as well answering questions and that he can return call to answer the other questions

## 2024-10-10 NOTE — Telephone Encounter (Signed)
 Pt called and said that he needs to talk to you because he is confused about two wk  recovery for work or PT. CB#[phone]

## 2024-11-19 ENCOUNTER — Encounter (HOSPITAL_BASED_OUTPATIENT_CLINIC_OR_DEPARTMENT_OTHER): Payer: Self-pay | Admitting: Orthopaedic Surgery

## 2024-11-19 ENCOUNTER — Other Ambulatory Visit: Payer: Self-pay

## 2024-11-27 ENCOUNTER — Ambulatory Visit (HOSPITAL_BASED_OUTPATIENT_CLINIC_OR_DEPARTMENT_OTHER)

## 2024-11-27 ENCOUNTER — Encounter (HOSPITAL_BASED_OUTPATIENT_CLINIC_OR_DEPARTMENT_OTHER)
Admission: RE | Disposition: A | Discharge status: Home / Self Care | Payer: Self-pay | Source: Home / Self Care | Attending: Orthopaedic Surgery

## 2024-11-27 ENCOUNTER — Other Ambulatory Visit: Payer: Self-pay

## 2024-11-27 ENCOUNTER — Encounter (HOSPITAL_BASED_OUTPATIENT_CLINIC_OR_DEPARTMENT_OTHER): Payer: Self-pay | Admitting: Orthopaedic Surgery

## 2024-11-27 ENCOUNTER — Ambulatory Visit (HOSPITAL_BASED_OUTPATIENT_CLINIC_OR_DEPARTMENT_OTHER)
Admission: RE | Admit: 2024-11-27 | Discharge: 2024-11-27 | Disposition: A | Discharge status: Home / Self Care | Attending: Orthopaedic Surgery | Admitting: Orthopaedic Surgery

## 2024-11-27 DIAGNOSIS — R569 Unspecified convulsions: Secondary | ICD-10-CM | POA: Diagnosis not present

## 2024-11-27 DIAGNOSIS — G5702 Lesion of sciatic nerve, left lower limb: Secondary | ICD-10-CM | POA: Diagnosis present

## 2024-11-27 DIAGNOSIS — F1290 Cannabis use, unspecified, uncomplicated: Secondary | ICD-10-CM | POA: Insufficient documentation

## 2024-11-27 HISTORY — DX: Acute embolism and thrombosis of unspecified deep veins of unspecified lower extremity: I82.409

## 2024-11-27 HISTORY — DX: Nausea with vomiting, unspecified: R11.2

## 2024-11-27 MED ORDER — TRANEXAMIC ACID-NACL 1000-0.7 MG/100ML-% IV SOLN
INTRAVENOUS | Status: AC
  Filled 2024-11-27: qty 100

## 2024-11-27 MED ORDER — CEFAZOLIN SODIUM-DEXTROSE 2-4 GM/100ML-% IV SOLN
2.0000 g | INTRAVENOUS | Status: AC
  Administered 2024-11-27: 2 g via INTRAVENOUS

## 2024-11-27 MED ORDER — ACETAMINOPHEN 500 MG PO TABS: 500.0000 mg | ORAL_TABLET | Freq: Three times a day (TID) | ORAL | 0 refills | Status: AC

## 2024-11-27 MED ORDER — HYDROMORPHONE HCL 1 MG/ML IJ SOLN
0.2500 mg | INTRAMUSCULAR | Status: DC | PRN
  Administered 2024-11-27: 0.5 mg via INTRAVENOUS

## 2024-11-27 MED ORDER — OXYCODONE HCL 5 MG/5ML PO SOLN: 5.0000 mg | Freq: Once | ORAL | Status: AC | PRN

## 2024-11-27 MED ORDER — 0.9 % SODIUM CHLORIDE (POUR BTL) OPTIME
TOPICAL | Status: DC | PRN
  Administered 2024-11-27: 1000 mL

## 2024-11-27 MED ORDER — ACETAMINOPHEN 500 MG PO TABS
1000.0000 mg | ORAL_TABLET | Freq: Once | ORAL | Status: AC
  Administered 2024-11-27: 1000 mg via ORAL

## 2024-11-27 MED ORDER — LACTATED RINGERS IV SOLN: INTRAVENOUS | Status: DC

## 2024-11-27 MED ORDER — BUPIVACAINE HCL 0.25 % IJ SOLN
INTRAMUSCULAR | Status: DC | PRN
  Administered 2024-11-27: 20 mL

## 2024-11-27 MED ORDER — OXYCODONE HCL 5 MG PO TABS
ORAL_TABLET | ORAL | Status: AC
  Filled 2024-11-27: qty 1

## 2024-11-27 MED ORDER — DROPERIDOL 2.5 MG/ML IJ SOLN: 0.6250 mg | Freq: Once | INTRAMUSCULAR | Status: DC | PRN

## 2024-11-27 MED ORDER — FENTANYL CITRATE (PF) 100 MCG/2ML IJ SOLN
INTRAMUSCULAR | Status: DC | PRN
  Administered 2024-11-27: 100 ug via INTRAVENOUS

## 2024-11-27 MED ORDER — MIDAZOLAM HCL (PF) 2 MG/2ML IJ SOLN
INTRAMUSCULAR | Status: DC | PRN
  Administered 2024-11-27: 2 mg via INTRAVENOUS

## 2024-11-27 MED ORDER — SUGAMMADEX SODIUM 200 MG/2ML IV SOLN
INTRAVENOUS | Status: DC | PRN
  Administered 2024-11-27: 200 mg via INTRAVENOUS

## 2024-11-27 MED ORDER — LIDOCAINE HCL (CARDIAC) PF 100 MG/5ML IV SOSY
PREFILLED_SYRINGE | INTRAVENOUS | Status: DC | PRN
  Administered 2024-11-27: 60 mg via INTRAVENOUS

## 2024-11-27 MED ORDER — ACETAMINOPHEN 500 MG PO TABS
ORAL_TABLET | ORAL | Status: AC
  Filled 2024-11-27: qty 2

## 2024-11-27 MED ORDER — CEFAZOLIN SODIUM-DEXTROSE 2-4 GM/100ML-% IV SOLN
INTRAVENOUS | Status: AC
  Filled 2024-11-27: qty 100

## 2024-11-27 MED ORDER — ASPIRIN 325 MG PO TBEC: 325.0000 mg | DELAYED_RELEASE_TABLET | Freq: Every day | ORAL | 0 refills | Status: AC

## 2024-11-27 MED ORDER — HYDROMORPHONE HCL 1 MG/ML IJ SOLN
INTRAMUSCULAR | Status: AC
  Filled 2024-11-27: qty 0.5

## 2024-11-27 MED ORDER — DEXAMETHASONE SODIUM PHOSPHATE 4 MG/ML IJ SOLN
INTRAMUSCULAR | Status: DC | PRN
  Administered 2024-11-27: 8 mg via INTRAVENOUS

## 2024-11-27 MED ORDER — IBUPROFEN 800 MG PO TABS: 800.0000 mg | ORAL_TABLET | Freq: Three times a day (TID) | ORAL | 0 refills | Status: AC

## 2024-11-27 MED ORDER — PROPOFOL 10 MG/ML IV BOLUS
INTRAVENOUS | Status: DC | PRN
  Administered 2024-11-27: 120 mg via INTRAVENOUS

## 2024-11-27 MED ORDER — OXYCODONE HCL 5 MG PO TABS
5.0000 mg | ORAL_TABLET | Freq: Once | ORAL | Status: AC | PRN
  Administered 2024-11-27: 5 mg via ORAL

## 2024-11-27 MED ORDER — ROCURONIUM BROMIDE 100 MG/10ML IV SOLN
INTRAVENOUS | Status: DC | PRN
  Administered 2024-11-27: 60 mg via INTRAVENOUS

## 2024-11-27 MED ORDER — OXYCODONE HCL 5 MG PO TABS: 5.0000 mg | ORAL_TABLET | ORAL | 0 refills | Status: AC | PRN

## 2024-11-27 MED ORDER — PROPOFOL 500 MG/50ML IV EMUL
INTRAVENOUS | Status: DC | PRN
  Administered 2024-11-27: 150 ug/kg/min via INTRAVENOUS

## 2024-11-27 MED ORDER — GABAPENTIN 300 MG PO CAPS
ORAL_CAPSULE | ORAL | Status: AC
  Filled 2024-11-27: qty 1

## 2024-11-27 MED ORDER — FENTANYL CITRATE (PF) 100 MCG/2ML IJ SOLN
INTRAMUSCULAR | Status: AC
  Filled 2024-11-27: qty 2

## 2024-11-27 MED ORDER — MIDAZOLAM HCL 2 MG/2ML IJ SOLN
INTRAMUSCULAR | Status: AC
  Filled 2024-11-27: qty 2

## 2024-11-27 MED ORDER — GABAPENTIN 300 MG PO CAPS
300.0000 mg | ORAL_CAPSULE | Freq: Once | ORAL | Status: AC
  Administered 2024-11-27: 300 mg via ORAL

## 2024-11-27 MED ORDER — ONDANSETRON HCL 4 MG/2ML IJ SOLN: 4.0000 mg | Freq: Once | INTRAMUSCULAR | Status: DC | PRN

## 2024-11-27 MED ORDER — ONDANSETRON HCL 4 MG/2ML IJ SOLN
INTRAMUSCULAR | Status: DC | PRN
  Administered 2024-11-27: 4 mg via INTRAVENOUS

## 2024-11-27 MED ORDER — DEXMEDETOMIDINE HCL IN NACL 80 MCG/20ML IV SOLN
INTRAVENOUS | Status: DC | PRN
  Administered 2024-11-27: 8 ug via INTRAVENOUS

## 2024-11-27 MED ORDER — TRANEXAMIC ACID-NACL 1000-0.7 MG/100ML-% IV SOLN
1000.0000 mg | INTRAVENOUS | Status: AC
  Administered 2024-11-27: 1000 mg via INTRAVENOUS

## 2024-11-27 NOTE — Transfer of Care (Signed)
 Immediate Anesthesia Transfer of Care Note  Patient: Gabriel Whitney  Procedure(s) Performed: RELEASE, MUSCLE, PIRIFORMIS (Left: Hip)  Patient Location: PACU  Anesthesia Type:General  Level of Consciousness: awake and patient cooperative  Airway & Oxygen Therapy: Patient Spontanous Breathing  Post-op Assessment: Report given to RN and Post -op Vital signs reviewed and stable  Post vital signs: Reviewed and stable  Last Vitals:  Vitals Value Taken Time  BP 105/70 11/27/24 10:49  Temp    Pulse 74 11/27/24 10:50  Resp 10 11/27/24 10:50  SpO2 92 % 11/27/24 10:50  Vitals shown include unfiled device data.  Last Pain:  Vitals:   11/27/24 0728  TempSrc: Temporal  PainSc: 0-No pain      Patients Stated Pain Goal: 4 (11/27/24 0728)  Complications: No notable events documented.

## 2024-11-27 NOTE — Addendum Note (Signed)
 Addendum  created 11/27/24 1431 by Donnell Berwyn SQUIBB, CRNA   Flowsheet accepted

## 2024-11-27 NOTE — Brief Op Note (Signed)
" ° °  Brief Op Note  Date of Surgery: 11/27/2024  Preoperative Diagnosis: LEFT PIRIFORMIS SYNDROME  Postoperative Diagnosis: same  Procedure: Procedures: RELEASE, MUSCLE, PIRIFORMIS  Implants: * No implants in log *  Surgeons: Surgeon(s): Genelle Standing, MD  Anesthesia: General    Estimated Blood Loss: See anesthesia record  Complications: None  Condition to PACU: Stable  Standing LITTIE Genelle, MD 11/27/2024 10:37 AM  "

## 2024-11-27 NOTE — Anesthesia Procedure Notes (Addendum)
 Procedure Name: Intubation Date/Time: 11/27/2024 10:01 AM  Performed by: Donnell Berwyn SQUIBB, CRNAPre-anesthesia Checklist: Patient identified, Emergency Drugs available, Suction available, Patient being monitored and Timeout performed Patient Re-evaluated:Patient Re-evaluated prior to induction Oxygen Delivery Method: Circle system utilized Preoxygenation: Pre-oxygenation with 100% oxygen Induction Type: IV induction Ventilation: Mask ventilation without difficulty Laryngoscope Size: Mac and 4 Grade View: Grade I Tube type: Oral Tube size: 7.5 mm Number of attempts: 1 Airway Equipment and Method: Stylet Placement Confirmation: ETT inserted through vocal cords under direct vision, positive ETCO2 and breath sounds checked- equal and bilateral Secured at: 22 cm Tube secured with: Tape Dental Injury: Teeth and Oropharynx as per pre-operative assessment

## 2024-11-27 NOTE — Anesthesia Preprocedure Evaluation (Addendum)
"                                    Anesthesia Evaluation  Patient identified by MRN, date of birth, ID band Patient awake    Reviewed: Allergy & Precautions, NPO status , Patient's Chart, lab work & pertinent test results  History of Anesthesia Complications (+) PONV and history of anesthetic complications  Airway Mallampati: II  TM Distance: >3 FB     Dental  (+) Teeth Intact, Dental Advisory Given   Pulmonary neg pulmonary ROS   Pulmonary exam normal breath sounds clear to auscultation       Cardiovascular negative cardio ROS Normal cardiovascular exam Rhythm:Regular Rate:Normal     Neuro/Psych Seizures -,  PSYCHIATRIC DISORDERS  Depression       GI/Hepatic negative GI ROS, Neg liver ROS,,,  Endo/Other  negative endocrine ROS    Renal/GU negative Renal ROS  negative genitourinary   Musculoskeletal Left pyriformis syndrome   Abdominal   Peds  Hematology negative hematology ROS (+)     Anesthesia Other Findings   Reproductive/Obstetrics                              Anesthesia Physical Anesthesia Plan  ASA: 2  Anesthesia Plan: General   Post-op Pain Management: Precedex  and Tylenol  PO (pre-op)*   Induction: Intravenous  PONV Risk Score and Plan: 4 or greater and Treatment may vary due to age or medical condition, Midazolam , Ondansetron , Dexamethasone , Propofol  infusion and TIVA  Airway Management Planned: Oral ETT  Additional Equipment: None  Intra-op Plan:   Post-operative Plan: Extubation in OR  Informed Consent: I have reviewed the patients History and Physical, chart, labs and discussed the procedure including the risks, benefits and alternatives for the proposed anesthesia with the patient or authorized representative who has indicated his/her understanding and acceptance.     Dental advisory given  Plan Discussed with: CRNA and Anesthesiologist  Anesthesia Plan Comments:           Anesthesia Quick Evaluation  "

## 2024-11-27 NOTE — H&P (Signed)
 Expand All Collapse All       Chief Complaint: Left hip pain        History of Present Illness:    09/26/2024: Presents today for follow-up status post left hip sciatic neurolysis with Dr. Burnetta.  He did get extremely good relief from this   Gabriel Whitney is a 44 y.o. male presents today with 3 years of ongoing radiating pain down the left hip.  Has been seen by both Dr. Burnetta and Dr. Beuford.  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  He did have an epidural injection as well as physical therapy under the guidance of Dr. Burnetta.  He subsequently was referred here by Dr. Beuford for assessment of possible piriformis syndrome.  He does have pain focally about the posterior ischiofemoral space with shooting pain down the leg in this distribution.  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       PMH/PSH/Family History/Social History/Meds/Allergies:         Past Medical History:  Diagnosis Date   Blood in stool     Chicken pox     Depression     Seizures (HCC)               Past Surgical History:  Procedure Laterality Date   arthroscopic right shoulder       CERVICAL FUSION       NECK SURGERY       SHOULDER SURGERY Left     SHOULDER SURGERY Right          Social History         Socioeconomic History   Marital status: Single      Spouse name: Not on file   Number of children: Not on file   Years of education: Not on file   Highest education level: Not on file  Occupational History   Not on file  Tobacco Use   Smoking status: Never   Smokeless tobacco: Never  Substance and Sexual Activity   Alcohol use: Yes   Drug use: Yes      Types: Marijuana   Sexual activity: Not on file  Other Topics Concern   Not on file  Social History Narrative   Not on file    Social Drivers of Health        Financial Resource Strain: Not on file  Food Insecurity: Not on file   Transportation Needs: Not on file  Physical Activity: Not on file  Stress: Not on file  Social Connections: Unknown (03/09/2022)    Received from [facility]    Social Network     Social Network: Not on file    No family history on file.     Allergies  No Known Allergies         Current Outpatient Medications  Medication Sig Dispense Refill   ELIQUIS 5 MG TABS tablet Take 5 mg by mouth 2 (two) times daily.   0      No current facility-administered medications for this visit.      Imaging Results (Last 48 hours)  No results found.     Review of Systems:   A ROS was performed including pertinent positives and negatives as documented in the HPI.   Physical Exam :   Constitutional: NAD and appears stated age Neurological: Alert and oriented Psych: Appropriate affect and cooperative There were no  vitals taken for this visit.    Comprehensive Musculoskeletal Exam:     Left hip with focal pain about the posterior piriformis ischiofemoral space which is worsened with internal rotation of the left hip.  Distal neurosensory exam is intact with shooting radiating pain down the posterior left thigh positive straight leg raise     Imaging:         I personally reviewed and interpreted the radiographs.     Assessment and Plan:   44 y.o. 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  He has trialed physical therapy and has had an epidural injection without significant relief.  He did get very good relief from his diagnostic injection with Dr. Burnetta around the sciatic nerve.  Given this I did discuss that he ultimately would be a candidate for piriformis release.  I discussed risk limitations associated with this as well as SOSi recovery timeframe.  After discussion he would like to proceed   - For left hip piriformis release     After a lengthy discussion of treatment options, including risks, benefits, alternatives,  complications of surgical and nonsurgical conservative options, the patient elected surgical repair.    The patient  is aware of the material risks  and complications including, but not limited to injury to adjacent structures, neurovascular injury, infection, numbness, bleeding, implant failure, thermal burns, stiffness, persistent pain, failure to heal, disease transmission from allograft, need for further surgery, dislocation, anesthetic risks, blood clots, risks of death,and others. The probabilities of surgical success and failure discussed with patient given their particular co-morbidities.The time and nature of expected rehabilitation and recovery was discussed.The patient's questions were all answered preoperatively.  No barriers to understanding were noted. I explained the natural history of the disease process and Rx rationale.  I explained to the patient what I considered to be reasonable expectations given their personal situation.  The final treatment plan was arrived at through a shared patient decision making process model.       I personally saw and evaluated the patient, and participated in the management and treatment plan.   Elspeth Parker, MD Attending Physician, Orthopedic Surgery   This document was dictated using Dragon voice recognition software. A reasonable attempt at proof reading has been made to minimize errors.

## 2024-11-27 NOTE — Anesthesia Postprocedure Evaluation (Signed)
"   Anesthesia Post Note  Patient: Tripp Weathington  Procedure(s) Performed: RELEASE, MUSCLE, PIRIFORMIS (Left: Hip)     Patient location during evaluation: PACU Anesthesia Type: General Level of consciousness: awake and alert and oriented Pain management: pain level controlled Vital Signs Assessment: post-procedure vital signs reviewed and stable Respiratory status: spontaneous breathing, nonlabored ventilation and respiratory function stable Cardiovascular status: blood pressure returned to baseline and stable Postop Assessment: no apparent nausea or vomiting Anesthetic complications: no   No notable events documented.  Last Vitals:  Vitals:   11/27/24 1125 11/27/24 1145  BP: 111/73 118/83  Pulse: 62 71  Resp: 16 16  Temp:  (!) 36.2 C  SpO2: 95% 97%    Last Pain:  Vitals:   11/27/24 1145  TempSrc:   PainSc: 4                  Willam Munford A.      "

## 2024-11-27 NOTE — Discharge Instructions (Addendum)
 "    Discharge Instructions    Attending Surgeon: Elspeth Parker, MD Office Phone Number: [phone]   Diagnosis and Procedures:    Surgeries Performed: Left hip piriformis release  Discharge Plan:    Diet: Resume usual diet. Begin with light or bland foods.  Drink plenty of fluids.  Activity:  Weight bearing and activity as tolerated left leg. You are advised to go home directly from the hospital or surgical center. Restrict your activities.  GENERAL INSTRUCTIONS: 1.  Please apply ice to your wound to help with swelling and inflammation. This will improve your comfort and your overall recovery following surgery.     2. Please call Dr. Danetta office at [phone] with questions Monday-Friday during business hours. If no one answers, please leave a message and someone should get back to the patient within 24 hours. For emergencies please call 911 or proceed to the emergency room.   3. Patient to notify surgical team if experiences any of the following: Bowel/Bladder dysfunction, uncontrolled pain, nerve/muscle weakness, incision with increased drainage or redness, nausea/vomiting and Fever greater than 101.0 F.  Be alert for signs of infection including redness, streaking, odor, fever or chills. Be alert for excessive pain or bleeding and notify your surgeon immediately.  WOUND INSTRUCTIONS:   Leave your dressing, cast, or splint in place until your post operative visit.  Keep it clean and dry.  Always keep the incision clean and dry until the staples/sutures are removed. If there is no drainage from the incision you should keep it open to air. If there is drainage from the incision you must keep it covered at all times until the drainage stops  Do not soak in a bath tub, hot tub, pool, lake or other body of water until 21 days after your surgery and your incision is completely dry and healed.  If you have removable sutures (or staples) they must be removed 10-14 days  (unless otherwise instructed) from the day of your surgery.     1)  Elevate the extremity as much as possible.  2)  Keep the dressing clean and dry.  3)  Please call us  if the dressing becomes wet or dirty.  4)  If you are experiencing worsening pain or worsening swelling, please call.     MEDICATIONS: Resume all previous home medications at the previous prescribed dose and frequency unless otherwise noted Start taking the  pain medications on an as-needed basis as prescribed  Please taper down pain medication over the next week following surgery.  Ideally you should not require a refill of any narcotic pain medication.  Take pain medication with food to minimize nausea. In addition to the prescribed pain medication, you may take over-the-counter pain relievers such as Tylenol .  Do NOT take additional tylenol  if your pain medication already has tylenol  in it.  Aspirin  325mg  daily per instructions on bottle. Narcotic policy: Per [facility] clinic policy, our goal is ensure optimal postoperative pain control with a multimodal pain management strategy. For all [facility] patients, our goal is to wean post-operative narcotic medications by 6 weeks post-operatively, and many times sooner. If this is not possible due to utilization of pain medication prior to surgery, your [facility] doctor will support your acute post-operative pain control for the first 6 weeks postoperatively, with a plan to transition you back to your primary pain team following that. [facility] will work to ensure a therapist, occupational.       FOLLOWUP INSTRUCTIONS: 1. Follow up at  the Physical Therapy Clinic 3-4 days following surgery. This appointment should be scheduled unless other arrangements have been made.The Physical Therapy scheduling number is [phone] if an appointment has not already been arranged.  2. Contact Dr. Danetta office during office hours at [phone] or the practice after hours line at [phone] for  non-emergencies. For medical emergencies call 911.   Discharge Location: Home  **No tylenol  until after 1:30 pm  Post Anesthesia Home Care Instructions  Activity: Get plenty of rest for the remainder of the day. A responsible individual must stay with you for 24 hours following the procedure.  For the next 24 hours, DO NOT: -Drive a car -Advertising copywriter -Drink alcoholic beverages -Take any medication unless instructed by your physician -Make any legal decisions or sign important papers.  Meals: Start with liquid foods such as gelatin or soup. Progress to regular foods as tolerated. Avoid greasy, spicy, heavy foods. If nausea and/or vomiting occur, drink only clear liquids until the nausea and/or vomiting subsides. Call your physician if vomiting continues.  Special Instructions/Symptoms: Your throat may feel dry or sore from the anesthesia or the breathing tube placed in your throat during surgery. If this causes discomfort, gargle with warm salt water. The discomfort should disappear within 24 hours.  If you had a scopolamine patch placed behind your ear for the management of post- operative nausea and/or vomiting:  1. The medication in the patch is effective for 72 hours, after which it should be removed.  Wrap patch in a tissue and discard in the trash. Wash hands thoroughly with soap and water. 2. You may remove the patch earlier than 72 hours if you experience unpleasant side effects which may include dry mouth, dizziness or visual disturbances. 3. Avoid touching the patch. Wash your hands with soap and water after contact with the patch.    "

## 2024-11-27 NOTE — Op Note (Signed)
" ° °  Date of Surgery: 11/27/2024  INDICATIONS: Gabriel Whitney is a 44 y.o.-year-old male with left hip piriformis syndrome.  The risk and benefits of the procedure were discussed in detail and documented in the pre-operative evaluation.   PREOPERATIVE DIAGNOSIS: 1.  Left hip piriformis syndrome  POSTOPERATIVE DIAGNOSIS: Same.  PROCEDURE: 1.  Left hip piriformis release open  SURGEON: Elspeth LITTIE Parker MD  ASSISTANT: Conley Dawson, ATC  ANESTHESIA:  general  IV FLUIDS AND URINE: See anesthesia record.  ANTIBIOTICS: Ancef   ESTIMATED BLOOD LOSS: 10 mL.  IMPLANTS:  * No implants in log *  DRAINS: None  CULTURES: None  COMPLICATIONS: none  DESCRIPTION OF PROCEDURE:   I identified the patient in the pre-operative holding area.  I marked the operative hip with my initials. I reviewed the risks and benefits of the proposed surgical intervention and the patient wished to proceed.   Patient was subsequently taken back to the operating room.  The patient was transferred to the operative suite and placed in the supine position with all bony prominences padded.     SCDs were placed on the non-operative lower extremity.  He was placed in the lateral decubitus position with beanbag positioner.  Appropriate antibiotics was administered within 1 hour before incision. The operative lower extremity was then prepped and draped in standard fashion. A time out was performed confirming the correct extremity, correct patient and correct procedure.    A standard sterile lateral approach was made to the hip.  15 blade was used to incise through skin.  The fibers of the gluteus maximus were identified and split in line.  At this time the gluteus medius was retracted with a Hohmann.  The piriformis was identified.  Right angle clamp was placed under it.  Electrocautery was used to dissect this.  This did immediately retract and was quite tight prior to dissection.   That concluded the case.  Skin was closed  with 2-0 Vicryl and 3-0 nylon. Xeroform gauze, gauze, Tegaderm, Iceman and brace were applied.  Instrument, sponge, and needle counts were correct prior to wound closure and at the conclusion of the case.  The patient was taken to the PACU without complication     POSTOPERATIVE PLAN: He will be weightbearing activity as tolerated.  He will placed on aspirin  325 mg for blood clot prevention.  He will not require formal physical therapy  Elspeth LITTIE Parker, MD 10:41 AM    "

## 2024-11-28 ENCOUNTER — Encounter (HOSPITAL_BASED_OUTPATIENT_CLINIC_OR_DEPARTMENT_OTHER): Payer: Self-pay | Admitting: Orthopaedic Surgery

## 2024-11-30 ENCOUNTER — Telehealth: Payer: Self-pay | Admitting: Orthopaedic Surgery

## 2024-11-30 NOTE — Telephone Encounter (Signed)
 Pt submitted medical release form, FMLA, and $20.00 payment

## 2024-12-04 NOTE — Telephone Encounter (Signed)
 Received

## 2024-12-12 ENCOUNTER — Encounter (HOSPITAL_BASED_OUTPATIENT_CLINIC_OR_DEPARTMENT_OTHER): Admitting: Orthopaedic Surgery
